# Patient Record
Sex: Male | Born: 1955 | Race: Black or African American | Hispanic: No | Marital: Married | Smoking: Never smoker
Health system: Southern US, Community
[De-identification: ages and names within clinical notes are randomized; demographics above are authoritative.]

## PROBLEM LIST (undated history)

## (undated) DIAGNOSIS — E119 Type 2 diabetes mellitus without complications: Secondary | ICD-10-CM

---

## 2005-07-03 ENCOUNTER — Inpatient Hospital Stay: Payer: Self-pay | Admitting: Internal Medicine

## 2006-06-30 ENCOUNTER — Emergency Department: Payer: Self-pay | Admitting: Emergency Medicine

## 2013-08-09 ENCOUNTER — Ambulatory Visit: Payer: Self-pay | Admitting: Family Medicine

## 2013-08-09 ENCOUNTER — Emergency Department: Payer: Self-pay

## 2013-08-09 LAB — COMPREHENSIVE METABOLIC PANEL
ALBUMIN: 3.2 g/dL — AB (ref 3.4–5.0)
ALK PHOS: 214 U/L — AB
ANION GAP: 4 — AB (ref 7–16)
AST: 20 U/L (ref 15–37)
BUN: 8 mg/dL (ref 7–18)
Bilirubin,Total: 0.4 mg/dL (ref 0.2–1.0)
CO2: 26 mmol/L (ref 21–32)
CREATININE: 0.84 mg/dL (ref 0.60–1.30)
Calcium, Total: 8.9 mg/dL (ref 8.5–10.1)
Chloride: 100 mmol/L (ref 98–107)
EGFR (African American): >60
EGFR (Non-African Amer.): >60
Glucose: 477 mg/dL — ABNORMAL HIGH (ref 65–99)
OSMOLALITY: 280 (ref 275–301)
Potassium: 4.1 mmol/L (ref 3.5–5.1)
SGPT (ALT): 20 U/L (ref 12–78)
SODIUM: 130 mmol/L — AB (ref 136–145)
Total Protein: 7.2 g/dL (ref 6.4–8.2)

## 2013-08-09 LAB — URINALYSIS, COMPLETE
Bacteria: NONE SEEN
Bilirubin,UR: NEGATIVE
Blood: NEGATIVE
Glucose,UR: >=500 mg/dL (ref 0–75)
Ketone: NEGATIVE
Leukocyte Esterase: NEGATIVE
Nitrite: NEGATIVE
PROTEIN: NEGATIVE
Ph: 6 (ref 4.5–8.0)
RBC,UR: <1 /HPF (ref 0–5)
SQUAMOUS EPITHELIAL: NONE SEEN
Specific Gravity: 1.033 (ref 1.003–1.030)
WBC UR: <1 /HPF (ref 0–5)

## 2013-08-09 LAB — CBC
HCT: 39.9 % — ABNORMAL LOW (ref 40.0–52.0)
HGB: 13.6 g/dL (ref 13.0–18.0)
MCH: 27.6 pg (ref 26.0–34.0)
MCHC: 34.2 g/dL (ref 32.0–36.0)
MCV: 81 fL (ref 80–100)
PLATELETS: 216 10*3/uL (ref 150–440)
RBC: 4.95 10*6/uL (ref 4.40–5.90)
RDW: 12.5 % (ref 11.5–14.5)
WBC: 4.4 10*3/uL (ref 3.8–10.6)

## 2014-01-26 ENCOUNTER — Ambulatory Visit: Payer: Self-pay | Admitting: Gastroenterology

## 2015-01-22 ENCOUNTER — Encounter: Payer: Self-pay | Admitting: Emergency Medicine

## 2015-01-22 ENCOUNTER — Emergency Department: Payer: Self-pay

## 2015-01-22 ENCOUNTER — Emergency Department
Admission: EM | Admit: 2015-01-22 | Discharge: 2015-01-22 | Disposition: A | Discharge status: Home / Self Care | Payer: Self-pay | Attending: Student | Admitting: Student

## 2015-01-22 ENCOUNTER — Other Ambulatory Visit: Payer: Self-pay

## 2015-01-22 DIAGNOSIS — Y9289 Other specified places as the place of occurrence of the external cause: Secondary | ICD-10-CM | POA: Insufficient documentation

## 2015-01-22 DIAGNOSIS — E119 Type 2 diabetes mellitus without complications: Secondary | ICD-10-CM | POA: Insufficient documentation

## 2015-01-22 DIAGNOSIS — Z79899 Other long term (current) drug therapy: Secondary | ICD-10-CM | POA: Insufficient documentation

## 2015-01-22 DIAGNOSIS — X58XXXA Exposure to other specified factors, initial encounter: Secondary | ICD-10-CM | POA: Insufficient documentation

## 2015-01-22 DIAGNOSIS — R05 Cough: Secondary | ICD-10-CM | POA: Insufficient documentation

## 2015-01-22 DIAGNOSIS — S29012A Strain of muscle and tendon of back wall of thorax, initial encounter: Secondary | ICD-10-CM

## 2015-01-22 DIAGNOSIS — Y9389 Activity, other specified: Secondary | ICD-10-CM | POA: Insufficient documentation

## 2015-01-22 DIAGNOSIS — M25512 Pain in left shoulder: Secondary | ICD-10-CM

## 2015-01-22 DIAGNOSIS — Y998 Other external cause status: Secondary | ICD-10-CM | POA: Insufficient documentation

## 2015-01-22 DIAGNOSIS — I10 Essential (primary) hypertension: Secondary | ICD-10-CM | POA: Insufficient documentation

## 2015-01-22 HISTORY — DX: Type 2 diabetes mellitus without complications: E11.9

## 2015-01-22 LAB — BASIC METABOLIC PANEL
Anion gap: 11 (ref 5–15)
BUN: 9 mg/dL (ref 6–20)
CHLORIDE: 93 mmol/L — AB (ref 101–111)
CO2: 25 mmol/L (ref 22–32)
Calcium: 9.1 mg/dL (ref 8.9–10.3)
Creatinine, Ser: 0.92 mg/dL (ref 0.61–1.24)
GFR calc non Af Amer: >60 mL/min (ref >60)
GLUCOSE: 479 mg/dL — AB (ref 65–99)
Potassium: 3.8 mmol/L (ref 3.5–5.1)
Sodium: 129 mmol/L — ABNORMAL LOW (ref 135–145)

## 2015-01-22 LAB — CBC
HCT: 42.4 % (ref 40.0–52.0)
HEMOGLOBIN: 14.1 g/dL (ref 13.0–18.0)
MCH: 26.6 pg (ref 26.0–34.0)
MCHC: 33.3 g/dL (ref 32.0–36.0)
MCV: 79.9 fL — ABNORMAL LOW (ref 80.0–100.0)
Platelets: 220 10*3/uL (ref 150–440)
RBC: 5.3 MIL/uL (ref 4.40–5.90)
RDW: 12.9 % (ref 11.5–14.5)
WBC: 4.1 10*3/uL (ref 3.8–10.6)

## 2015-01-22 LAB — GLUCOSE, CAPILLARY: Glucose-Capillary: 231 mg/dL — ABNORMAL HIGH (ref 65–99)

## 2015-01-22 LAB — TROPONIN I: Troponin I: <0.03 ng/mL (ref <0.031)

## 2015-01-22 MED ORDER — MORPHINE SULFATE 4 MG/ML IJ SOLN
INTRAMUSCULAR | Status: AC
  Filled 2015-01-22: qty 1

## 2015-01-22 MED ORDER — TRAMADOL HCL 50 MG PO TABS: 50.0000 mg | ORAL_TABLET | Freq: Three times a day (TID) | ORAL | Status: AC | PRN

## 2015-01-22 MED ORDER — INSULIN ASPART 100 UNIT/ML ~~LOC~~ SOLN
6.0000 [IU] | Freq: Once | SUBCUTANEOUS | Status: AC
  Administered 2015-01-22: 6 [IU] via INTRAVENOUS
  Filled 2015-01-22: qty 6

## 2015-01-22 MED ORDER — ONDANSETRON HCL 4 MG/2ML IJ SOLN
INTRAMUSCULAR | Status: AC
  Filled 2015-01-22: qty 2

## 2015-01-22 MED ORDER — ASPIRIN 81 MG PO CHEW
CHEWABLE_TABLET | ORAL | Status: AC
  Administered 2015-01-22: 324 mg via ORAL
  Filled 2015-01-22: qty 4

## 2015-01-22 MED ORDER — MORPHINE SULFATE 4 MG/ML IJ SOLN
4.0000 mg | Freq: Once | INTRAMUSCULAR | Status: AC
  Administered 2015-01-22: 4 mg via INTRAVENOUS

## 2015-01-22 MED ORDER — ASPIRIN 81 MG PO CHEW
324.0000 mg | CHEWABLE_TABLET | Freq: Once | ORAL | Status: AC
  Administered 2015-01-22: 324 mg via ORAL

## 2015-01-22 MED ORDER — ONDANSETRON HCL 4 MG/2ML IJ SOLN
4.0000 mg | Freq: Once | INTRAMUSCULAR | Status: AC
  Administered 2015-01-22: 4 mg via INTRAVENOUS

## 2015-01-22 NOTE — ED Provider Notes (Addendum)
[facility] Emergency Department Provider Note  ____________________________________________  Time seen: Approximately 6:33 PM  I have reviewed the triage vital signs and the nursing notes.   HISTORY  Chief Complaint Chest Pain    HPI Jim Cherry is a 59 y.o. male with diabetes, noncompliant with all diabetes medications, HTN, HLD who presents for evaluation of atraumatic left chest/shoulder/arm pain, gradual onset, constant for the past 3 weeks, worse with movement/specific position changes. Not worsened with exertion, not associated with any shortness of breath, lightheadedness, nausea or vomiting. No history of coronary artery disease. Pain is not pleuritic in nature. No history of DVT or PE. Current severity is mild. He has had mild cough but otherwise has been in his usual state of health.   Past Medical History  Diagnosis Date  . Diabetes mellitus without complication     There are no active problems to display for this patient.   History reviewed. No pertinent past surgical history.  Current Outpatient Rx  Name  Route  Sig  Dispense  Refill  . atorvastatin (LIPITOR) 20 MG tablet   Oral   Take 1 tablet by mouth daily.         Marland Kitchen glipiZIDE (GLUCOTROL XL) 5 MG 24 hr tablet   Oral   Take 1-2 tablets by mouth 2 (two) times daily. Pt takes 2 tablets in the am and 1 in the pm         . lisinopril (PRINIVIL,ZESTRIL) 5 MG tablet   Oral   Take 1 tablet by mouth daily.         . metFORMIN (GLUCOPHAGE) 500 MG tablet   Oral   Take 500 mg by mouth daily.           Allergies Ibuprofen  No family history on file.  Social History History  Substance Use Topics  . Smoking status: Never Smoker   . Smokeless tobacco: Not on file  . Alcohol Use: No    Review of Systems Constitutional: No fever/chills Eyes: No visual changes. ENT: No sore throat. Cardiovascular: + left chest pain. Respiratory: Denies shortness of  breath. Gastrointestinal: No abdominal pain.  No nausea, no vomiting.  No diarrhea.  No constipation. Genitourinary: Negative for dysuria. Musculoskeletal: Negative for back pain. Skin: Negative for rash. Neurological: Negative for headaches, focal weakness or numbness.  10-point ROS otherwise negative.  ____________________________________________   PHYSICAL EXAM:   VITAL SIGNS   Constitutional: Alert and oriented. Well appearing and in no acute distress. Eyes: Conjunctivae are normal. PERRL. EOMI. Head: Atraumatic. Nose: No congestion/rhinnorhea. Mouth/Throat: Mucous membranes are moist.  Oropharynx non-erythematous. Neck: No stridor.   Cardiovascular: Normal rate, regular rhythm. Grossly normal heart sounds.  Good peripheral circulation. Respiratory: Normal respiratory effort.  No retractions. Lungs CTAB. Gastrointestinal: Soft and nontender. No distention. No abdominal bruits. No CVA tenderness. Genitourinary: deferred Musculoskeletal: Full mildly painful range of motion at the left shoulder, no tenderness or swelling or asymmetry throughout the left upper extremity, 2+ left radial pulse. Mildly tender to palpation throughout the left paravertebral muscles of the thoracic spine, no midline tenderness. Neurologic:  Normal speech and language. No gross focal neurologic deficits are appreciated. No gait instability. 5 out of 5 strength in bilateral upper and lower extremities, sensation intact to light touch throughout. Skin:  Skin is warm, dry and intact. No rash noted. Psychiatric: Mood and affect are normal. Speech and behavior are normal.  ____________________________________________   LABS (all labs ordered are listed, but only abnormal  results are displayed)  Labs Reviewed  BASIC METABOLIC PANEL - Abnormal; Notable for the following:    Sodium 129 (*)    Chloride 93 (*)    Glucose, Bld 479 (*)    All other components within normal limits  CBC - Abnormal; Notable for  the following:    MCV 79.9 (*)    All other components within normal limits  GLUCOSE, CAPILLARY - Abnormal; Notable for the following:    Glucose-Capillary 231 (*)    All other components within normal limits  TROPONIN I   ____________________________________________  EKG  ED ECG REPORT I, Gayla Doss, the attending physician, personally viewed and interpreted this ECG.   Date: 01/22/2015  EKG Time: 17:06  Rate: 85  Rhythm: sinus rhythm with first-degree AV block  Axis: Normal axis  Intervals:first-degree A-V block   ST&T Change: No acute ST segment elevation. No Q waves, no T-wave inversions. No change when compared to EKG in February 2015. ____________________________________________  RADIOLOGY  CXR FINDINGS: Lungs are clear. No pleural effusion or pneumothorax.  The heart is normal in size.  Visualized osseous structures are within normal limits.  IMPRESSION: No evidence of acute cardiopulmonary disease.   ____________________________________________   PROCEDURES  Procedure(s) performed: None  Critical Care performed: No  ____________________________________________   INITIAL IMPRESSION / ASSESSMENT AND PLAN / ED COURSE  Pertinent labs & imaging results that were available during my care of the patient were reviewed by me and considered in my medical decision making (see chart for details).  Jim Cherry is a 59 y.o. male with diabetes, noncompliant with all diabetes medications, HTN, HLD who presents for evaluation of left chest/shoulder/arm pain, gradual onset, constant for the past 3 weeks, worse with movement/specific position changes. He was initially feeling well on arrival to ER but developed pain during CXR "when I had to lift my arms up and hug the machine". On exam, he is very well-appearing and in no acute distress. Vital signs stable, he is afebrile. Suspect musculoskeletal pain related to the left upper back, left shoulder. He does not  specifically endorse a separate/discrete chest pain, he has no exertional complaints, no associated symptoms and I doubt ACS (HEART score 3 - low risk for ACS), acute aortic dissection, or PE. Troponin is negative. EKG is reassuring. Labs are notable for pseudohyponatremia related to hyperglycemia, no evidence of DKA. Hyponatremia corrects appropriately given degree of glucose elevation. He has been noncompliant with his diabetic medications for 8 months and has not been checking his blood sugar at home. We'll give IV fluids, insulin, I've encouraged compliance with his medications. We'll treat his pain.  ----------------------------------------- 8:47 PM on 01/22/2015 ----------------------------------------- Patient with complete resolution of pain at this time. Glucose improved to 231. I discussed need for close PCP follow-up, need for compliance with all medications, discussed extensive return precautions and he and family at bedside are comfortable with the discharge plan.  ____________________________________________   FINAL CLINICAL IMPRESSION(S) / ED DIAGNOSES  Final diagnoses:  Shoulder pain, acute, left  Muscle strain of left upper back, initial encounter      Gayla Doss, MD 01/22/15 2050  Gayla Doss, MD 01/22/15 [id number]  Gayla Doss, MD 01/22/15 2101

## 2015-01-22 NOTE — ED Notes (Signed)
Pt reports intermittent chest pain today.  No sob.  States productive cough with yellow phlegm  Nonsmoker.  No fever.  States pain radiates into left arm.  No chest pain now.  Family at bedside.

## 2015-01-22 NOTE — ED Notes (Signed)
Pt comes into the ED c/o chest pain for the past 3 weeks.  States that "it moves around but lately it has been going down into my left arm".  Denies any shortness of breath or dizziness along with it.  No history of heart problems noted.

## 2019-02-23 ENCOUNTER — Encounter: Payer: Self-pay | Admitting: Emergency Medicine

## 2019-02-23 ENCOUNTER — Emergency Department
Admission: EM | Admit: 2019-02-23 | Discharge: 2019-02-23 | Disposition: A | Discharge status: Home / Self Care | Payer: Self-pay | Attending: Emergency Medicine | Admitting: Emergency Medicine

## 2019-02-23 ENCOUNTER — Emergency Department: Payer: Self-pay

## 2019-02-23 ENCOUNTER — Other Ambulatory Visit: Payer: Self-pay

## 2019-02-23 DIAGNOSIS — Z79899 Other long term (current) drug therapy: Secondary | ICD-10-CM | POA: Insufficient documentation

## 2019-02-23 DIAGNOSIS — R0789 Other chest pain: Secondary | ICD-10-CM | POA: Insufficient documentation

## 2019-02-23 DIAGNOSIS — E119 Type 2 diabetes mellitus without complications: Secondary | ICD-10-CM | POA: Insufficient documentation

## 2019-02-23 DIAGNOSIS — Z7984 Long term (current) use of oral hypoglycemic drugs: Secondary | ICD-10-CM | POA: Insufficient documentation

## 2019-02-23 LAB — BASIC METABOLIC PANEL
Anion gap: 10 (ref 5–15)
BUN: 25 mg/dL — ABNORMAL HIGH (ref 8–23)
CO2: 24 mmol/L (ref 22–32)
Calcium: 9.2 mg/dL (ref 8.9–10.3)
Chloride: 102 mmol/L (ref 98–111)
Creatinine, Ser: 1.1 mg/dL (ref 0.61–1.24)
GFR calc Af Amer: >60 mL/min (ref >60)
GFR calc non Af Amer: >60 mL/min (ref >60)
Glucose, Bld: 286 mg/dL — ABNORMAL HIGH (ref 70–99)
Potassium: 4.2 mmol/L (ref 3.5–5.1)
Sodium: 136 mmol/L (ref 135–145)

## 2019-02-23 LAB — CBC
HCT: 40.2 % (ref 39.0–52.0)
Hemoglobin: 13.4 g/dL (ref 13.0–17.0)
MCH: 28.2 pg (ref 26.0–34.0)
MCHC: 33.3 g/dL (ref 30.0–36.0)
MCV: 84.6 fL (ref 80.0–100.0)
Platelets: 241 10*3/uL (ref 150–400)
RBC: 4.75 MIL/uL (ref 4.22–5.81)
RDW: 13.2 % (ref 11.5–15.5)
WBC: 4 10*3/uL (ref 4.0–10.5)
nRBC: 0 % (ref 0.0–0.2)

## 2019-02-23 LAB — TROPONIN I (HIGH SENSITIVITY): Troponin I (High Sensitivity): 2 ng/L (ref <18)

## 2019-02-23 NOTE — ED Provider Notes (Signed)
[facility] Emergency Department Provider Note  ____________________________________________  Time seen: Approximately 5:12 PM  I have reviewed the triage vital signs and the nursing notes.   HISTORY  Chief Complaint Chest Pain    HPI Jim Cherry is a 63 y.o. male with a history of diabetes who complains of gradual onset of left shoulder pain radiating to the left lateral chest under his shoulder blade at times.  Is been going on for the past 10 days, intermittent.  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  Denies any falls or injuries.  Not exertional, not pleuritic, not associated with shortness of breath diaphoresis or vomiting.  Currently pain-free.  Denies any dizziness or syncope.  Pain is moderate intensity and sharp when present      Past Medical History:  Diagnosis Date  . Diabetes mellitus without complication (HCC)      There are no active problems to display for this patient.    History reviewed. No pertinent surgical history.   Prior to Admission medications   Medication Sig Start Date End Date Taking? Authorizing Provider  atorvastatin (LIPITOR) 20 MG tablet Take 1 tablet by mouth daily. 09/12/14 04/25/15  [provider]  glipiZIDE (GLUCOTROL XL) 5 MG 24 hr tablet Take 1-2 tablets by mouth 2 (two) times daily. Pt takes 2 tablets in the am and 1 in the pm 09/12/14   [provider]  lisinopril (PRINIVIL,ZESTRIL) 5 MG tablet Take 1 tablet by mouth daily. 04/24/14 04/24/15  [provider]  metFORMIN (GLUCOPHAGE) 500 MG tablet Take 500 mg by mouth daily.    [provider]     Allergies Ibuprofen   No family history on file.  Social History Social History   Tobacco Use  . Smoking status: Never Smoker  . Smokeless tobacco: Never Used  Substance Use Topics  . Alcohol use: No  . Drug use: No    Review of Systems  Constitutional:   No fever or  chills.  ENT:   No sore throat. No rhinorrhea. Cardiovascular: No central chest pain, no syncope. Respiratory:   No dyspnea or cough. Gastrointestinal:   Negative for abdominal pain, vomiting and diarrhea.  Musculoskeletal: Shoulder/chest pain as above All other systems reviewed and are negative except as documented above in ROS and HPI.  ____________________________________________   PHYSICAL EXAM:  VITAL SIGNS: ED Triage Vitals  Enc Vitals Group     BP 02/23/19 1515 (!) 126/49     Pulse Rate 02/23/19 1515 96     Resp 02/23/19 1515 16     Temp 02/23/19 1515 98.7 F (37.1 C)     Temp Source 02/23/19 1515 Oral     SpO2 02/23/19 1515 98 %     Weight 02/23/19 1516 175 lb (79.4 kg)     Height 02/23/19 1516 5\' 5"  (1.651 m)     Head Circumference --      Peak Flow --      Pain Score 02/23/19 1515 0     Pain Loc --      Pain Edu? --      Excl. in GC? --     Vital signs reviewed, nursing assessments reviewed.   Constitutional:   Alert and oriented. Non-toxic appearance. Eyes:   Conjunctivae are normal. EOMI. PERRL. ENT      Head:   Normocephalic and atraumatic.         Neck:   No meningismus.  Full ROM. Hematological/Lymphatic/Immunilogical:   No cervical lymphadenopathy. Cardiovascular:   RRR. Symmetric bilateral radial and DP pulses.  No murmurs. Cap refill less than 2 seconds. Respiratory:   Normal respiratory effort without tachypnea/retractions. Breath sounds are clear and equal bilaterally. No wheezes/rales/rhonchi. Gastrointestinal:   Soft and nontender. Non distended. There is no CVA tenderness.  No rebound, rigidity, or guarding.  Musculoskeletal:   Normal range of motion in all extremities. No joint effusions.  No lower extremity tenderness.  No edema.  Reproducible chest wall pain at the inferior scapular edge.  Exacerbated by arm abduction across the chest. Neurologic:   Normal speech and language.  Motor grossly intact. No acute focal neurologic deficits are  appreciated.  Skin:    Skin is warm, dry and intact. No rash noted.  No petechiae, purpura, or bullae.  ____________________________________________    LABS (pertinent positives/negatives) (all labs ordered are listed, but only abnormal results are displayed) Labs Reviewed  BASIC METABOLIC PANEL - Abnormal; Notable for the following components:      Result Value   Glucose, Bld 286 (*)    BUN 25 (*)    All other components within normal limits  CBC  TROPONIN I (HIGH SENSITIVITY)  TROPONIN I (HIGH SENSITIVITY)   ____________________________________________   EKG  Interpreted by me Normal sinus rhythm rate of 92, normal axis intervals QRS ST segments and T waves.  No ischemic changes.  ____________________________________________    RADIOLOGY  Dg Chest 2 View  Result Date: 02/23/2019 CLINICAL DATA:  Acute chest pain for several weeks. EXAM: CHEST - 2 VIEW COMPARISON:  01/22/2015 chest radiograph FINDINGS: The cardiomediastinal silhouette is unremarkable. There is no evidence of focal airspace disease, pulmonary edema, suspicious pulmonary nodule/mass, pleural effusion, or pneumothorax. No acute bony abnormalities are identified. IMPRESSION: No active cardiopulmonary disease. Electronically Signed   By: Harmon Pier M.D.   On: 02/23/2019 15:44    ____________________________________________   PROCEDURES Procedures  ____________________________________________    CLINICAL IMPRESSION / ASSESSMENT AND PLAN / ED COURSE  Medications ordered in the ED: Medications - No data to display  Pertinent labs & imaging results that were available during my care of the patient were reviewed by me and considered in my medical decision making (see chart for details).  Jim Cherry was evaluated in Emergency Department on 02/23/2019 for the symptoms described in the history of present illness. He was evaluated in the context of the global COVID-19 pandemic, which necessitated  consideration that the patient might be at risk for infection with the SARS-CoV-2 virus that causes COVID-19. Institutional protocols and algorithms that pertain to the evaluation of patients at risk for COVID-19 are in a state of rapid change based on information released by regulatory bodies including the CDC and federal and state organizations. These policies and algorithms were followed during the patient's care in the ED.   Patient presents with musculoskeletal chest wall pain.Considering the patient's symptoms, medical history, and physical examination today, I have low suspicion for ACS, PE, TAD, pneumothorax, carditis, mediastinitis, pneumonia, CHF, or sepsis.  EKG chest x-ray and first troponin all negative.  In this case, symptoms are noncardiac, and no further cardiac work-up is warranted.  Initial troponin was sent by triage protocol but not actually indicated.  Stable for discharge home, continue NSAIDs, heat therapy, follow-up with primary care.      ____________________________________________   FINAL CLINICAL IMPRESSION(S) / ED DIAGNOSES    Final diagnoses:  Chest wall pain     ED  Discharge Orders    None      Portions of this note were generated with dragon dictation software. Dictation errors may occur despite best attempts at proofreading.   Carrie Mew, MD 02/23/19 [phone]

## 2019-02-23 NOTE — ED Triage Notes (Signed)
Pt in via POV, reports intermittent left side chest pain with radiation into left arm x approximately two weeks, pt also reports new onset fatigue upon exertion.  NAD noted at this time.

## 2019-09-10 ENCOUNTER — Emergency Department
Admission: EM | Admit: 2019-09-10 | Discharge: 2019-09-10 | Discharge status: Against Medical Advice | Payer: No Typology Code available for payment source | Attending: Emergency Medicine | Admitting: Emergency Medicine

## 2019-09-10 ENCOUNTER — Emergency Department: Payer: No Typology Code available for payment source

## 2019-09-10 ENCOUNTER — Other Ambulatory Visit: Payer: Self-pay

## 2019-09-10 DIAGNOSIS — Y9389 Activity, other specified: Secondary | ICD-10-CM | POA: Insufficient documentation

## 2019-09-10 DIAGNOSIS — R55 Syncope and collapse: Secondary | ICD-10-CM | POA: Insufficient documentation

## 2019-09-10 DIAGNOSIS — Z79899 Other long term (current) drug therapy: Secondary | ICD-10-CM | POA: Insufficient documentation

## 2019-09-10 DIAGNOSIS — Y998 Other external cause status: Secondary | ICD-10-CM | POA: Insufficient documentation

## 2019-09-10 DIAGNOSIS — Z7984 Long term (current) use of oral hypoglycemic drugs: Secondary | ICD-10-CM | POA: Insufficient documentation

## 2019-09-10 DIAGNOSIS — Y92410 Unspecified street and highway as the place of occurrence of the external cause: Secondary | ICD-10-CM | POA: Insufficient documentation

## 2019-09-10 DIAGNOSIS — Z5320 Procedure and treatment not carried out because of patient's decision for unspecified reasons: Secondary | ICD-10-CM | POA: Diagnosis not present

## 2019-09-10 DIAGNOSIS — E119 Type 2 diabetes mellitus without complications: Secondary | ICD-10-CM | POA: Insufficient documentation

## 2019-09-10 DIAGNOSIS — R413 Other amnesia: Secondary | ICD-10-CM | POA: Insufficient documentation

## 2019-09-10 LAB — URINALYSIS, COMPLETE (UACMP) WITH MICROSCOPIC
Bilirubin Urine: NEGATIVE
Glucose, UA: 50 mg/dL — AB
Hgb urine dipstick: NEGATIVE
Ketones, ur: NEGATIVE mg/dL
Leukocytes,Ua: NEGATIVE
Nitrite: NEGATIVE
Protein, ur: NEGATIVE mg/dL
Specific Gravity, Urine: 1.004 — ABNORMAL LOW (ref 1.005–1.030)
Squamous Epithelial / HPF: NONE SEEN (ref 0–5)
pH: 7 (ref 5.0–8.0)

## 2019-09-10 LAB — TROPONIN I (HIGH SENSITIVITY)
Troponin I (High Sensitivity): <2 ng/L (ref <18)
Troponin I (High Sensitivity): <2 ng/L (ref <18)

## 2019-09-10 LAB — COMPREHENSIVE METABOLIC PANEL
ALT: 21 U/L (ref 0–44)
AST: 19 U/L (ref 15–41)
Albumin: 3.7 g/dL (ref 3.5–5.0)
Alkaline Phosphatase: 122 U/L (ref 38–126)
Anion gap: 7 (ref 5–15)
BUN: 13 mg/dL (ref 8–23)
CO2: 25 mmol/L (ref 22–32)
Calcium: 8.9 mg/dL (ref 8.9–10.3)
Chloride: 103 mmol/L (ref 98–111)
Creatinine, Ser: 0.93 mg/dL (ref 0.61–1.24)
GFR calc Af Amer: >60 mL/min (ref >60)
GFR calc non Af Amer: >60 mL/min (ref >60)
Glucose, Bld: 166 mg/dL — ABNORMAL HIGH (ref 70–99)
Potassium: 3.8 mmol/L (ref 3.5–5.1)
Sodium: 135 mmol/L (ref 135–145)
Total Bilirubin: 0.7 mg/dL (ref 0.3–1.2)
Total Protein: 6.9 g/dL (ref 6.5–8.1)

## 2019-09-10 LAB — GLUCOSE, CAPILLARY: Glucose-Capillary: 194 mg/dL — ABNORMAL HIGH (ref 70–99)

## 2019-09-10 LAB — URINE DRUG SCREEN, QUALITATIVE (ARMC ONLY)
Amphetamines, Ur Screen: NOT DETECTED
Barbiturates, Ur Screen: NOT DETECTED
Benzodiazepine, Ur Scrn: NOT DETECTED
Cannabinoid 50 Ng, Ur ~~LOC~~: NOT DETECTED
Cocaine Metabolite,Ur ~~LOC~~: NOT DETECTED
MDMA (Ecstasy)Ur Screen: NOT DETECTED
Methadone Scn, Ur: NOT DETECTED
Opiate, Ur Screen: NOT DETECTED
Phencyclidine (PCP) Ur S: NOT DETECTED
Tricyclic, Ur Screen: NOT DETECTED

## 2019-09-10 LAB — CBC WITH DIFFERENTIAL/PLATELET
Abs Immature Granulocytes: 0.01 10*3/uL (ref 0.00–0.07)
Basophils Absolute: 0 10*3/uL (ref 0.0–0.1)
Basophils Relative: 1 %
Eosinophils Absolute: 0.1 10*3/uL (ref 0.0–0.5)
Eosinophils Relative: 3 %
HCT: 37.5 % — ABNORMAL LOW (ref 39.0–52.0)
Hemoglobin: 12.6 g/dL — ABNORMAL LOW (ref 13.0–17.0)
Immature Granulocytes: 0 %
Lymphocytes Relative: 30 %
Lymphs Abs: 0.8 10*3/uL (ref 0.7–4.0)
MCH: 27.5 pg (ref 26.0–34.0)
MCHC: 33.6 g/dL (ref 30.0–36.0)
MCV: 81.9 fL (ref 80.0–100.0)
Monocytes Absolute: 0.3 10*3/uL (ref 0.1–1.0)
Monocytes Relative: 9 %
Neutro Abs: 1.6 10*3/uL — ABNORMAL LOW (ref 1.7–7.7)
Neutrophils Relative %: 57 %
Platelets: 196 10*3/uL (ref 150–400)
RBC: 4.58 MIL/uL (ref 4.22–5.81)
RDW: 12.4 % (ref 11.5–15.5)
WBC: 2.8 10*3/uL — ABNORMAL LOW (ref 4.0–10.5)
nRBC: 0 % (ref 0.0–0.2)

## 2019-09-10 LAB — ETHANOL: Alcohol, Ethyl (B): <10 mg/dL (ref <10)

## 2019-09-10 MED ORDER — SODIUM CHLORIDE 0.9 % IV BOLUS
1000.0000 mL | Freq: Once | INTRAVENOUS | Status: AC
  Administered 2019-09-10: 1000 mL via INTRAVENOUS
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
## 2019-09-10 NOTE — ED Notes (Signed)
PA Christiane Ha at bedside for updated plan of care.  Pt voiced concerns about wanting to go home at this time.  Will continue to moniotor./reasess

## 2019-09-10 NOTE — ED Notes (Signed)
Pt refused to eb admitted to hospital for observation. PA jonathan expplaints risks vs benefits of leaving against medical advice.  Pt verbalized understanding of risks to including up to death.  IV dc and pt ambulated out of ed with smooth/steady gait.
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
## 2023-01-15 ENCOUNTER — Emergency Department
Admission: EM | Admit: 2023-01-15 | Discharge: 2023-01-16 | Disposition: A | Discharge status: Home / Self Care | Payer: No Typology Code available for payment source | Attending: Emergency Medicine | Admitting: Emergency Medicine

## 2023-01-15 ENCOUNTER — Emergency Department: Payer: No Typology Code available for payment source

## 2023-01-15 ENCOUNTER — Other Ambulatory Visit: Payer: Self-pay

## 2023-01-15 DIAGNOSIS — R079 Chest pain, unspecified: Secondary | ICD-10-CM | POA: Diagnosis present

## 2023-01-15 DIAGNOSIS — E876 Hypokalemia: Secondary | ICD-10-CM | POA: Insufficient documentation

## 2023-01-15 DIAGNOSIS — R0789 Other chest pain: Secondary | ICD-10-CM | POA: Insufficient documentation

## 2023-01-15 DIAGNOSIS — Y92410 Unspecified street and highway as the place of occurrence of the external cause: Secondary | ICD-10-CM | POA: Insufficient documentation

## 2023-01-15 LAB — CBC
HCT: 35.6 % — ABNORMAL LOW (ref 39.0–52.0)
Hemoglobin: 12.7 g/dL — ABNORMAL LOW (ref 13.0–17.0)
MCH: 28.1 pg (ref 26.0–34.0)
MCHC: 35.7 g/dL (ref 30.0–36.0)
MCV: 78.8 fL — ABNORMAL LOW (ref 80.0–100.0)
Platelets: 250 10*3/uL (ref 150–400)
RBC: 4.52 MIL/uL (ref 4.22–5.81)
RDW: 12.9 % (ref 11.5–15.5)
WBC: 5.1 10*3/uL (ref 4.0–10.5)
nRBC: 0 % (ref 0.0–0.2)

## 2023-01-15 LAB — BASIC METABOLIC PANEL
Anion gap: 10 (ref 5–15)
BUN: 16 mg/dL (ref 8–23)
CO2: 26 mmol/L (ref 22–32)
Calcium: 9.1 mg/dL (ref 8.9–10.3)
Chloride: 97 mmol/L — ABNORMAL LOW (ref 98–111)
Creatinine, Ser: 1.15 mg/dL (ref 0.61–1.24)
GFR, Estimated: >60 mL/min (ref >60)
Glucose, Bld: 346 mg/dL — ABNORMAL HIGH (ref 70–99)
Potassium: 2.3 mmol/L — CL (ref 3.5–5.1)
Sodium: 133 mmol/L — ABNORMAL LOW (ref 135–145)

## 2023-01-15 LAB — TROPONIN I (HIGH SENSITIVITY)
Troponin I (High Sensitivity): 6 ng/L (ref <18)
Troponin I (High Sensitivity): 8 ng/L (ref <18)

## 2023-01-15 LAB — MAGNESIUM: Magnesium: 1.7 mg/dL (ref 1.7–2.4)

## 2023-01-15 MED ORDER — POTASSIUM CHLORIDE 10 MEQ/100ML IV SOLN
10.0000 meq | INTRAVENOUS | Status: AC
  Administered 2023-01-15 – 2023-01-16 (×4): 10 meq via INTRAVENOUS
  Filled 2023-01-15 (×3): qty 100

## 2023-01-15 MED ORDER — GLUCAGON HCL RDNA (DIAGNOSTIC) 1 MG IJ SOLR
1.0000 mg | Freq: Once | INTRAMUSCULAR | Status: AC
  Administered 2023-01-15: 1 mg via INTRAMUSCULAR

## 2023-01-15 MED ORDER — GLUCAGON HCL RDNA (DIAGNOSTIC) 1 MG IJ SOLR
INTRAMUSCULAR | Status: AC
  Filled 2023-01-15: qty 1

## 2023-01-15 MED ORDER — POTASSIUM CHLORIDE CRYS ER 20 MEQ PO TBCR: 20.0000 meq | EXTENDED_RELEASE_TABLET | Freq: Every day | ORAL | 0 refills | Status: AC

## 2023-01-15 MED ORDER — POTASSIUM CHLORIDE 10 MEQ/100ML IV SOLN
10.0000 meq | INTRAVENOUS | Status: DC
  Filled 2023-01-15: qty 100

## 2023-01-15 MED ORDER — FENTANYL CITRATE PF 50 MCG/ML IJ SOSY
50.0000 ug | PREFILLED_SYRINGE | Freq: Once | INTRAMUSCULAR | Status: AC
  Administered 2023-01-15: 50 ug via INTRAVENOUS
  Filled 2023-01-15: qty 1

## 2023-01-15 MED ORDER — POTASSIUM CHLORIDE CRYS ER 20 MEQ PO TBCR
40.0000 meq | EXTENDED_RELEASE_TABLET | Freq: Once | ORAL | Status: AC
  Administered 2023-01-15: 40 meq via ORAL
  Filled 2023-01-15: qty 2

## 2023-01-15 MED ORDER — POTASSIUM CHLORIDE 20 MEQ PO PACK
20.0000 meq | PACK | Freq: Once | ORAL | Status: AC
  Administered 2023-01-15: 20 meq via ORAL
  Filled 2023-01-15: qty 1
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
## 2023-01-15 NOTE — Discharge Instructions (Signed)
Please follow up with your primary care doctor to recheck your potassium level next week. Please seek medical attention for any high fevers, chest pain, shortness of breath, change in behavior, persistent vomiting, bloody stool or any other new or concerning symptoms.
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
## 2023-01-16 LAB — BASIC METABOLIC PANEL
Anion gap: 9 (ref 5–15)
BUN: 16 mg/dL (ref 8–23)
CO2: 24 mmol/L (ref 22–32)
Calcium: 8.6 mg/dL — ABNORMAL LOW (ref 8.9–10.3)
Chloride: 101 mmol/L (ref 98–111)
Creatinine, Ser: 1.15 mg/dL (ref 0.61–1.24)
GFR, Estimated: >60 mL/min (ref >60)
Glucose, Bld: 361 mg/dL — ABNORMAL HIGH (ref 70–99)
Potassium: 3.1 mmol/L — ABNORMAL LOW (ref 3.5–5.1)
Sodium: 134 mmol/L — ABNORMAL LOW (ref 135–145)

## 2023-01-16 NOTE — ED Notes (Signed)
Pt ambulatory to restroom

## 2023-01-16 NOTE — ED Notes (Signed)
Pt verbalizes understanding of discharge instructions.

## 2023-07-28 ENCOUNTER — Ambulatory Visit
Admission: EM | Admit: 2023-07-28 | Discharge: 2023-07-28 | Disposition: A | Discharge status: Home / Self Care | Payer: Self-pay | Attending: Family Medicine | Admitting: Family Medicine

## 2023-07-28 ENCOUNTER — Encounter: Payer: Self-pay | Admitting: Emergency Medicine

## 2023-07-28 DIAGNOSIS — Z7984 Long term (current) use of oral hypoglycemic drugs: Secondary | ICD-10-CM

## 2023-07-28 DIAGNOSIS — E1165 Type 2 diabetes mellitus with hyperglycemia: Secondary | ICD-10-CM | POA: Insufficient documentation

## 2023-07-28 DIAGNOSIS — I10 Essential (primary) hypertension: Secondary | ICD-10-CM | POA: Insufficient documentation

## 2023-07-28 DIAGNOSIS — R079 Chest pain, unspecified: Secondary | ICD-10-CM | POA: Insufficient documentation

## 2023-07-28 DIAGNOSIS — E785 Hyperlipidemia, unspecified: Secondary | ICD-10-CM | POA: Insufficient documentation

## 2023-07-28 LAB — COMPREHENSIVE METABOLIC PANEL
ALT: 20 U/L (ref 0–44)
AST: 19 U/L (ref 15–41)
Albumin: 4.3 g/dL (ref 3.5–5.0)
Alkaline Phosphatase: 142 U/L — ABNORMAL HIGH (ref 38–126)
Anion gap: 8 (ref 5–15)
BUN: 17 mg/dL (ref 8–23)
CO2: 24 mmol/L (ref 22–32)
Calcium: 9.4 mg/dL (ref 8.9–10.3)
Chloride: 97 mmol/L — ABNORMAL LOW (ref 98–111)
Creatinine, Ser: 1.14 mg/dL (ref 0.61–1.24)
GFR, Estimated: >60 mL/min (ref >60)
Glucose, Bld: 376 mg/dL — ABNORMAL HIGH (ref 70–99)
Potassium: 3.9 mmol/L (ref 3.5–5.1)
Sodium: 129 mmol/L — ABNORMAL LOW (ref 135–145)
Total Bilirubin: 1 mg/dL (ref 0.0–1.2)
Total Protein: 8.2 g/dL — ABNORMAL HIGH (ref 6.5–8.1)

## 2023-07-28 LAB — CBC WITH DIFFERENTIAL/PLATELET
Abs Immature Granulocytes: 0.01 10*3/uL (ref 0.00–0.07)
Basophils Absolute: 0 10*3/uL (ref 0.0–0.1)
Basophils Relative: 1 %
Eosinophils Absolute: 0.1 10*3/uL (ref 0.0–0.5)
Eosinophils Relative: 3 %
HCT: 37.9 % — ABNORMAL LOW (ref 39.0–52.0)
Hemoglobin: 13.4 g/dL (ref 13.0–17.0)
Immature Granulocytes: 0 %
Lymphocytes Relative: 32 %
Lymphs Abs: 1.4 10*3/uL (ref 0.7–4.0)
MCH: 27.7 pg (ref 26.0–34.0)
MCHC: 35.4 g/dL (ref 30.0–36.0)
MCV: 78.3 fL — ABNORMAL LOW (ref 80.0–100.0)
Monocytes Absolute: 0.3 10*3/uL (ref 0.1–1.0)
Monocytes Relative: 7 %
Neutro Abs: 2.5 10*3/uL (ref 1.7–7.7)
Neutrophils Relative %: 57 %
Platelets: 215 10*3/uL (ref 150–400)
RBC: 4.84 MIL/uL (ref 4.22–5.81)
RDW: 12.1 % (ref 11.5–15.5)
WBC: 4.3 10*3/uL (ref 4.0–10.5)
nRBC: 0 % (ref 0.0–0.2)

## 2023-07-28 LAB — TROPONIN I (HIGH SENSITIVITY): Troponin I (High Sensitivity): 3 ng/L (ref <18)

## 2023-07-28 MED ORDER — GLIPIZIDE ER 5 MG PO TB24: 5.0000 mg | ORAL_TABLET | Freq: Two times a day (BID) | ORAL | 0 refills | Status: AC

## 2023-07-28 MED ORDER — METFORMIN HCL 500 MG PO TABS: 500.0000 mg | ORAL_TABLET | Freq: Every day | ORAL | 1 refills | Status: AC

## 2023-07-28 MED ORDER — LISINOPRIL 5 MG PO TABS: 5.0000 mg | ORAL_TABLET | Freq: Every day | ORAL | 1 refills | Status: AC

## 2023-07-28 MED ORDER — ATORVASTATIN CALCIUM 20 MG PO TABS: 20.0000 mg | ORAL_TABLET | Freq: Every day | ORAL | 1 refills | Status: AC

## 2023-07-28 NOTE — ED Triage Notes (Addendum)
Patient presents with mid sternal chest heaviness x 2 days and states it radiates down his left leg. Denies dizziness, diaphoresis. Patient states he stopped all of his meds three months ago because they were not helping.

## 2023-07-28 NOTE — Discharge Instructions (Addendum)
Your blood work showed that you have an elevated blood sugar, which is consistent with having uncontrolled diabetes.  Your cardiac enzyme was normal and your chest x-ray does not show any acute signs of a heart attack at this time.  I have referred you to cardiology for further evaluation of the first-degree AV block that was found on your EKG today.  I have also sent over refills of your Lipitor, glipizide, lisinopril, and metformin.  I do request that you restart your medications as previously prescribed.  Call and make a follow-up appoint with Dr. Zada Finders to discuss your medications if you are not happy with the results or how they make you feel.  You may use over-the-counter Tylenol and/or ibuprofen according the package instructions as needed for any pain you may experience.  If you develop any increasing chest pain, especially associated with shortness of breath, sweating, pain going to your left jaw, nausea, dizziness, or fainting you need to call 911 and go to the ER.

## 2023-07-28 NOTE — ED Provider Notes (Signed)
[facility]    CSN: [id number] Arrival date & time: 07/28/23  1159      History   Chief Complaint No chief complaint on file.   HPI Jim Cherry is a 68 y.o. male.   HPI  68 year old male with a past medical history significant for diabetes, high cholesterol, and hypertension presents for evaluation of substernal chest heaviness that started 2 days ago and is still present.  He describes it as a 2/10.  He states that he thinks it comes from working 2 days ago as he had not worked in over a month.  He is reporting pain on his entire left side of his body that started after he picked up his golf bag and walked around a building.  The substernal chest pressure increased at that time and was associated with the shortness of breath.  Patient denies any increase in pain with movement, sweating, nausea, dizziness, or syncope.  The patient reports that he stopped all of his medications without consulting his physician 3 months or more ago.  He reports he did not like how they made him feel and that the metformin gave him diarrhea.  Past Medical History:  Diagnosis Date   Diabetes mellitus without complication (HCC)     There are no active problems to display for this patient.   History reviewed. No pertinent surgical history.     Home Medications    Prior to Admission medications   Medication Sig Start Date End Date Taking? Authorizing Provider  atorvastatin (LIPITOR) 20 MG tablet Take 1 tablet (20 mg total) by mouth daily. 07/28/23   Becky Augusta, NP  glipiZIDE (GLUCOTROL XL) 5 MG 24 hr tablet Take 1-2 tablets (5-10 mg total) by mouth 2 (two) times daily. Pt takes 2 tablets in the am and 1 in the pm 07/28/23   Becky Augusta, NP  lisinopril (ZESTRIL) 5 MG tablet Take 1 tablet (5 mg total) by mouth daily. 07/28/23   Becky Augusta, NP  metFORMIN (GLUCOPHAGE) 500 MG tablet Take 1 tablet (500 mg total) by mouth daily. 07/28/23   Becky Augusta, NP  potassium chloride SA  (KLOR-CON M) 20 MEQ tablet Take 1 tablet (20 mEq total) by mouth daily for 7 days. 01/15/23 01/22/23  Phineas Semen, MD    Family History History reviewed. No pertinent family history.  Social History Social History   Tobacco Use   Smoking status: Never   Smokeless tobacco: Never  Vaping Use   Vaping status: Never Used  Substance Use Topics   Alcohol use: No   Drug use: No     Allergies   Ibuprofen   Review of Systems Review of Systems  Constitutional:  Negative for diaphoresis.  Respiratory:  Positive for shortness of breath.   Cardiovascular:  Positive for chest pain. Negative for palpitations.  Gastrointestinal:  Negative for nausea.  Neurological:  Negative for dizziness and syncope.     Physical Exam Triage Vital Signs ED Triage Vitals  Encounter Vitals Group     BP 07/28/23 1228 (!) 144/92     Systolic BP Percentile --      Diastolic BP Percentile --      Pulse Rate 07/28/23 1228 94     Resp 07/28/23 1228 18     Temp 07/28/23 1228 98.7 F (37.1 C)     Temp Source 07/28/23 1228 Oral     SpO2 07/28/23 1228 97 %     Weight --      Height --  Head Circumference --      Peak Flow --      Pain Score 07/28/23 1229 2     Pain Loc --      Pain Education --      Exclude from Growth Chart --    No data found.  Updated Vital Signs BP (!) 144/92 (BP Location: Left Arm)   Pulse 94   Temp 98.7 F (37.1 C) (Oral)   Resp 18   SpO2 97%   Visual Acuity Right Eye Distance:   Left Eye Distance:   Bilateral Distance:    Right Eye Near:   Left Eye Near:    Bilateral Near:     Physical Exam Vitals and nursing note reviewed.  Constitutional:      Appearance: Normal appearance. He is not ill-appearing.  HENT:     Head: Normocephalic and atraumatic.  Cardiovascular:     Rate and Rhythm: Normal rate and regular rhythm.     Pulses: Normal pulses.     Heart sounds: Normal heart sounds. No murmur heard.    No friction rub. No gallop.  Pulmonary:      Effort: Pulmonary effort is normal.     Breath sounds: Normal breath sounds. No wheezing, rhonchi or rales.  Chest:     Chest wall: No tenderness.  Skin:    General: Skin is warm and dry.     Capillary Refill: Capillary refill takes less than 2 seconds.     Findings: No rash.  Neurological:     General: No focal deficit present.     Mental Status: He is alert and oriented to person, place, and time.      UC Treatments / Results  Labs (all labs ordered are listed, but only abnormal results are displayed) Labs Reviewed  CBC WITH DIFFERENTIAL/PLATELET - Abnormal; Notable for the following components:      Result Value   HCT 37.9 (*)    MCV 78.3 (*)    All other components within normal limits  COMPREHENSIVE METABOLIC PANEL - Abnormal; Notable for the following components:   Sodium 129 (*)    Chloride 97 (*)    Glucose, Bld 376 (*)    Total Protein 8.2 (*)    Alkaline Phosphatase 142 (*)    All other components within normal limits  TROPONIN I (HIGH SENSITIVITY)    EKG Normal sinus rhythm with first-degree AV block Ventricular rate 86 bpm PR interval 212 ms QRS duration 88 ms QT/QTc 462/433 ms Voltage criteria for LVH.  No acute ST or T wave abnormalities.  Radiology No results found.  Procedures Procedures (including critical care time)  Medications Ordered in UC Medications - No data to display  Initial Impression / Assessment and Plan / UC Course  I have reviewed the triage vital signs and the nursing notes.  Pertinent labs & imaging results that were available during my care of the patient were reviewed by me and considered in my medical decision making (see chart for details).   Patient is a nontoxic-appearing 68 year old male presenting for evaluation of substernal chest pressure as outlined HPI above.  The chest pressure had been coming and going for the last 2 days but was more constant this morning after he picked up his golf bag and walked around the  building.  The episode is still present and he rates it as a 2/10.  It was associate with shortness of breath but that has resolved.  No nausea, syncope, dizziness, or  diaphoresis.  The pain does not increase with movement.  Patient has not been taking his antihypertensives, cholesterol medication, or metformin in 3 months or more because he did not like how they made him feel.  He has not talked to his PCP, Dr. Zada Finders.  Physical exam reveals S1-S2 heart sounds with good rate rhythm and lungs are clear to auscultation all fields.  Chest is nontender to palpation.  EKG shows sinus rhythm with first-degree AV block and minimal voltage criteria for LVH.  Previous EKGs dated 01/18/2023 and 01/15/2023 both showed accelerated junctional rhythms.  Prior to that EKG on 09/10/2019 was normal sinus rhythm.  This is a new conduction delay.  I will obtain CBC, CMP, and troponin.  CBC shows normal white count and red count.  Hematocrit is mildly decreased at 37.9 as is the MCV at 78.3.  Platelets are normal with no abnormalities to differential.  CMP shows a sodium of 129, chloride of 97, and glucose is elevated at 376.  Total protein is mildly elevated at 8.2 with an alk phos of 142 remainder the transaminases are unremarkable.  Troponin I is 3.  I will refer patient to cardiology for further evaluation of his first-degree AV block and discharge him home with a diagnosis of chest pain.  He may continue to use over-the-counter Tylenol or ibuprofen as needed for pain.  I am also going to send over refills of his Lipitor 20 mg, glipizide 5 mg, lisinopril 5 mg, and metformin and request that he resume his medications for treatment of his hypertension, diabetes, and high cholesterol.  I am also going to encourage him to make a follow-up appointment with his PCP to discuss medication management.   Final Clinical Impressions(s) / UC Diagnoses   Final diagnoses:  Chest pain, unspecified type  Essential hypertension   Uncontrolled type 2 diabetes mellitus with hyperglycemia (HCC)  Hyperlipidemia, unspecified hyperlipidemia type     Discharge Instructions      Your blood work showed that you have an elevated blood sugar, which is consistent with having uncontrolled diabetes.  Your cardiac enzyme was normal and your chest x-ray does not show any acute signs of a heart attack at this time.  I have referred you to cardiology for further evaluation of the first-degree AV block that was found on your EKG today.  I have also sent over refills of your Lipitor, glipizide, lisinopril, and metformin.  I do request that you restart your medications as previously prescribed.  Call and make a follow-up appoint with Dr. Zada Finders to discuss your medications if you are not happy with the results or how they make you feel.  You may use over-the-counter Tylenol and/or ibuprofen according the package instructions as needed for any pain you may experience.  If you develop any increasing chest pain, especially associated with shortness of breath, sweating, pain going to your left jaw, nausea, dizziness, or fainting you need to call 911 and go to the ER.     ED Prescriptions     Medication Sig Dispense Auth. Provider   atorvastatin (LIPITOR) 20 MG tablet Take 1 tablet (20 mg total) by mouth daily. 30 tablet Becky Augusta, NP   glipiZIDE (GLUCOTROL XL) 5 MG 24 hr tablet Take 1-2 tablets (5-10 mg total) by mouth 2 (two) times daily. Pt takes 2 tablets in the am and 1 in the pm 120 tablet Becky Augusta, NP   lisinopril (ZESTRIL) 5 MG tablet Take 1 tablet (5 mg total) by mouth  daily. 30 tablet Becky Augusta, NP   metFORMIN (GLUCOPHAGE) 500 MG tablet Take 1 tablet (500 mg total) by mouth daily. 30 tablet Becky Augusta, NP      PDMP not reviewed this encounter.   Becky Augusta, NP 07/28/23 1353
